# Patient Record
Sex: Male | Born: 1970 | Race: Black or African American | Hispanic: No | Marital: Single | Smoking: Current every day smoker
Health system: Southern US, Community
[De-identification: ages and names within clinical notes are randomized; demographics above are authoritative.]

## PROBLEM LIST (undated history)

## (undated) DIAGNOSIS — I10 Essential (primary) hypertension: Secondary | ICD-10-CM

## (undated) HISTORY — PX: COSMETIC SURGERY: SHX468

## (undated) HISTORY — PX: MYRINGOTOMY: SUR874

## (undated) HISTORY — PX: APPENDECTOMY: SHX54

## (undated) HISTORY — PX: TONSILLECTOMY: SUR1361

---

## 2002-03-20 ENCOUNTER — Encounter (INDEPENDENT_AMBULATORY_CARE_PROVIDER_SITE_OTHER): Payer: Self-pay | Admitting: *Deleted

## 2002-03-20 ENCOUNTER — Ambulatory Visit (HOSPITAL_BASED_OUTPATIENT_CLINIC_OR_DEPARTMENT_OTHER): Admission: RE | Admit: 2002-03-20 | Discharge: 2002-03-20 | Payer: Self-pay | Admitting: Plastic Surgery

## 2002-09-23 ENCOUNTER — Emergency Department (HOSPITAL_COMMUNITY): Admission: EM | Admit: 2002-09-23 | Discharge: 2002-09-24 | Payer: Self-pay | Admitting: Emergency Medicine

## 2002-09-24 ENCOUNTER — Encounter: Payer: Self-pay | Admitting: Emergency Medicine

## 2008-12-31 ENCOUNTER — Emergency Department (HOSPITAL_COMMUNITY): Admission: EM | Admit: 2008-12-31 | Discharge: 2008-12-31 | Payer: Self-pay | Admitting: *Deleted

## 2010-05-29 ENCOUNTER — Emergency Department (HOSPITAL_COMMUNITY): Admission: EM | Admit: 2010-05-29 | Discharge: 2010-05-29 | Payer: Self-pay | Admitting: Emergency Medicine

## 2011-01-14 LAB — URINALYSIS, ROUTINE W REFLEX MICROSCOPIC
Bilirubin Urine: NEGATIVE
Glucose, UA: NEGATIVE mg/dL
Ketones, ur: NEGATIVE mg/dL
Leukocytes, UA: NEGATIVE
Nitrite: NEGATIVE
Protein, ur: NEGATIVE mg/dL
Specific Gravity, Urine: 1.02 (ref 1.005–1.030)
Urobilinogen, UA: 0.2 mg/dL (ref 0.0–1.0)
pH: 6 (ref 5.0–8.0)

## 2011-01-14 LAB — BASIC METABOLIC PANEL
BUN: 11 mg/dL (ref 6–23)
CO2: 24 mEq/L (ref 19–32)
Calcium: 9.4 mg/dL (ref 8.4–10.5)
Chloride: 105 mEq/L (ref 96–112)
Creatinine, Ser: 0.93 mg/dL (ref 0.4–1.5)
GFR calc Af Amer: >60 mL/min (ref >60)
GFR calc non Af Amer: >60 mL/min (ref >60)
Glucose, Bld: 105 mg/dL — ABNORMAL HIGH (ref 70–99)
Potassium: 4.2 mEq/L (ref 3.5–5.1)
Sodium: 137 mEq/L (ref 135–145)

## 2011-01-14 LAB — CBC
HCT: 48.2 % (ref 39.0–52.0)
Hemoglobin: 16.2 g/dL (ref 13.0–17.0)
MCH: 29.9 pg (ref 26.0–34.0)
MCHC: 33.5 g/dL (ref 30.0–36.0)
MCV: 89.1 fL (ref 78.0–100.0)
Platelets: 255 10*3/uL (ref 150–400)
RBC: 5.42 MIL/uL (ref 4.22–5.81)
RDW: 13.7 % (ref 11.5–15.5)
WBC: 8.1 10*3/uL (ref 4.0–10.5)

## 2011-01-14 LAB — HEPATIC FUNCTION PANEL
AST: 42 U/L — ABNORMAL HIGH (ref 0–37)
Albumin: 4.4 g/dL (ref 3.5–5.2)
Bilirubin, Direct: 0.1 mg/dL (ref 0.0–0.3)
Total Protein: 8.1 g/dL (ref 6.0–8.3)

## 2011-01-14 LAB — URINE MICROSCOPIC-ADD ON

## 2011-01-14 LAB — DIFFERENTIAL
Eosinophils Absolute: 0.2 10*3/uL (ref 0.0–0.7)
Lymphocytes Relative: 20 % (ref 12–46)
Lymphs Abs: 1.6 10*3/uL (ref 0.7–4.0)
Neutro Abs: 5.4 10*3/uL (ref 1.7–7.7)
Neutrophils Relative %: 66 % (ref 43–77)

## 2011-01-14 LAB — LIPASE, BLOOD: Lipase: 26 U/L (ref 11–59)

## 2011-01-14 LAB — AMYLASE: Amylase: 52 U/L (ref 0–105)

## 2011-03-17 NOTE — Op Note (Signed)
. [facility]  Patient:    DMANI, MIZER Visit Number: [id number] MRN: [id number]          Service Type: DSU Location: [facility] Attending Physician:  Eloise Levels Dictated by:   Mary A. Contogiannis, M.D. Admit Date:  03/20/2002 Discharge Date: 03/20/2002                             Operative Report  She says this is already dictated. Dictated by:   Mary A. Contogiannis, M.D. Attending Physician:  Eloise Levels DD:  03/20/02 TD:  03/24/02 Job: [id number]

## 2011-03-17 NOTE — Discharge Summary (Signed)
[facility]  Patient:    Mark Underwood, Mark Underwood Visit Number: [id number] MRN: [id number]          Service Type: DSU Location: [facility] Attending Physician:  Eloise Levels Dictated by:   Mary A. Contogiannis, M.D. Admit Date:  03/20/2002 Discharge Date: 03/20/2002                             Discharge Summary  PREOPERATIVE DIAGNOSIS:  Recurrent right buttock skin and soft tissue lesion.  POSTOPERATIVE DIAGNOSIS:  Recurrent right buttock skin and soft tissue lesion.  OPERATION: 1. Excision of 3.5 cm right buttock skin and soft tissue lesion. 2. Complex closure of 4.5 cm right buttock incision.  SURGEON:  Mary A. Contogiannis, M.D.  ANESTHESIA:  1% lidocaine with epinephrine.  INDICATIONS:  The patient is a 40 year old African-American male who had presented for evaluation of a mass in the right medial buttock.  This area appears to have had a recurrent infection.  It likely is a cyst or glandular tissue.  He presents to undergo excision of this soft tissue mass now that the infection has resolved.  DESCRIPTION OF PROCEDURE:  The patient was patient was brought operating room and placed on the table in the prone position.  The buttock areas were prepped with Betadine and draped in sterile fashion.  Skin and subcutaneous tissue in the area of the soft tissue mass and skin lesion were then injected with 1% lidocaine with epinephrine.  After adequate hemostasis and anesthesia had taken effect, the procedure was begun.  There was a skin pore that appeared to be clinically involved along with scar tissue on the skin.  All of this area was excised with about a 1 to 2 mm margin of normal skin around it.  The incision was carried through the skin into the fatty layer.  Upon entering the fatty layer, there was more apparent scar tissue present, and all of this was excised down to the superficial fascial layer.  The total area of excision measured  3.5 cm.  The wound was then closed in complex fashion.  The superficial fascial layer and deeper tissues were closed with 3-0 Monocryl suture.  The dermal layer was then closed with 3-0 Monocryl suture.  The skin was then closed with a 4-0 Monocryl intracuticular stitch.  The incision was dressed with Benzoin and Steri-Strips.  There were no complications.  The patient tolerated the procedure well.  The patient was then taught proper wound care and discharged home in stable condition.  He will finish out his perioperative antibiotics.  Followup appointment will be tomorrow in the office. Dictated by:   Mary A. Contogiannis, M.D. Attending Physician:  Eloise Levels DD:  03/20/02 TD:  03/22/02 Job: [id number]

## 2011-05-23 IMAGING — CT CT ABD-PELV W/O CM
3 of 4 series · 8 of 46 positions shown, 15 images · non-contrast
Comparison: None.

CLINICAL DATA: Right flank pain.  Previous appendectomy

CT ABDOMEN AND PELVIS WITHOUT CONTRAST
TECHNIQUE: Multidetector CT imaging of the abdomen and pelvis was
performed following the standard protocol without intravenous
contrast.

[Series 3: lung 5.0 b60f · axial · 0.70mm/px · z∈[-122,-62]mm · 4 of 22 slices shown, 9 images]
[im 5/22  soft-tissue]
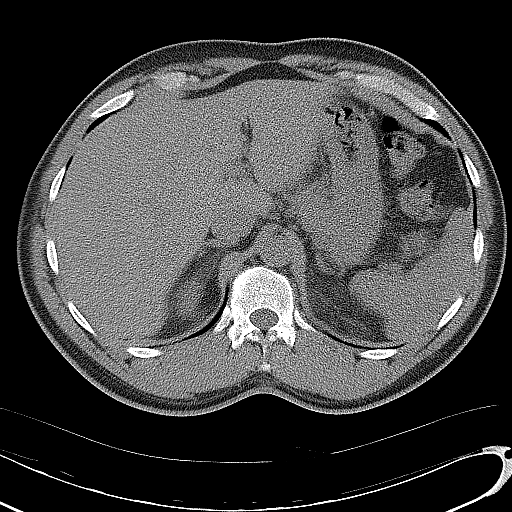
[im 5/22  lung]
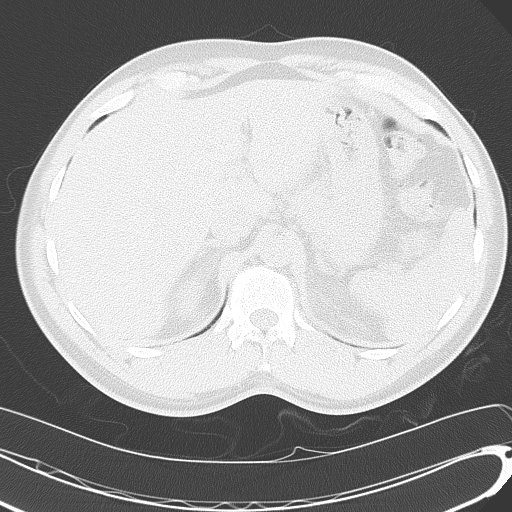
[im 5/22  bone]
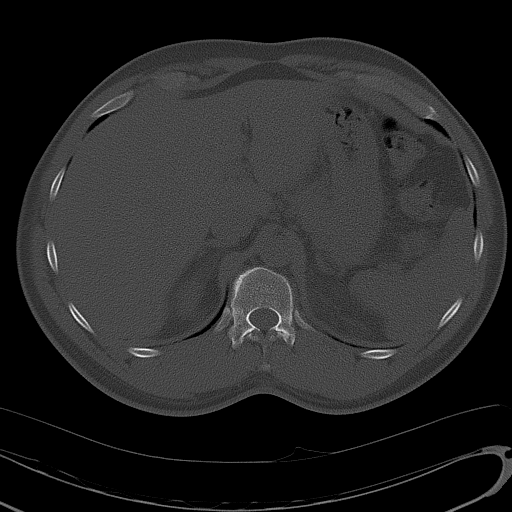
[im 9/22  soft-tissue]
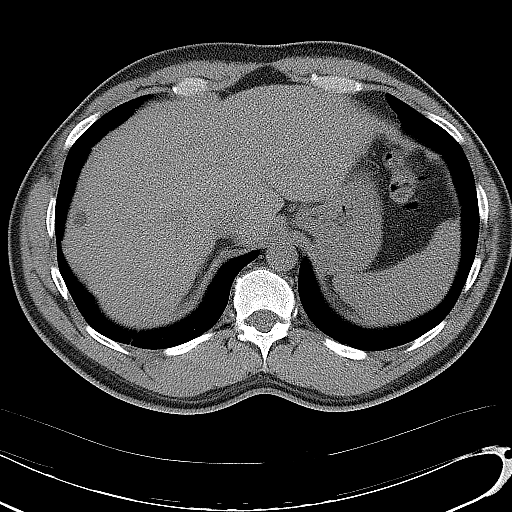
[im 9/22  lung]
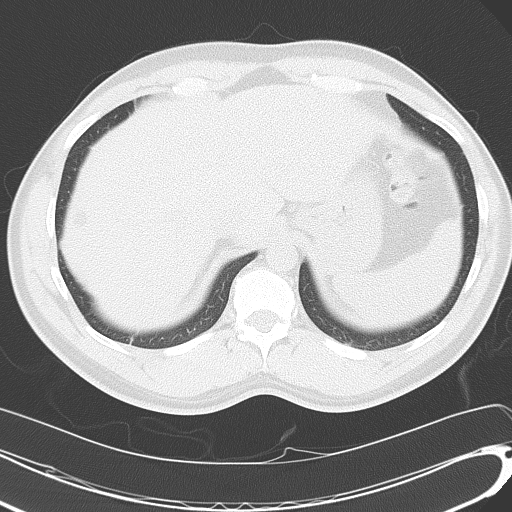
[im 13/22  soft-tissue]
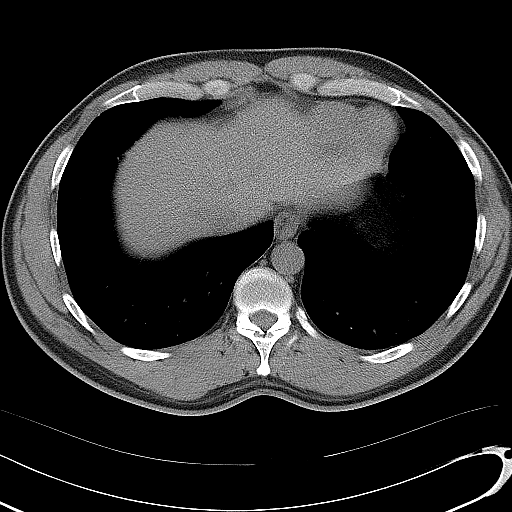
[im 13/22  lung]
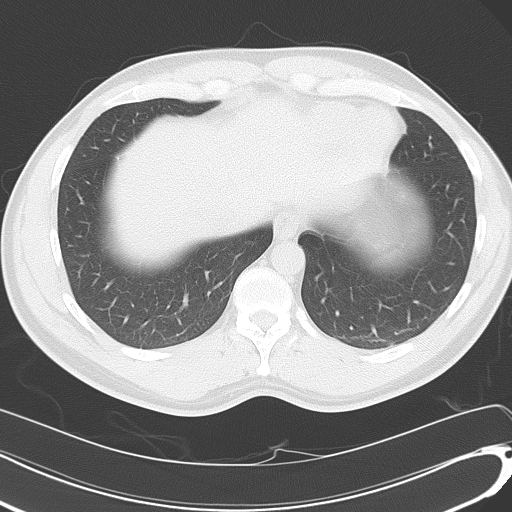
[im 17/22  soft-tissue]
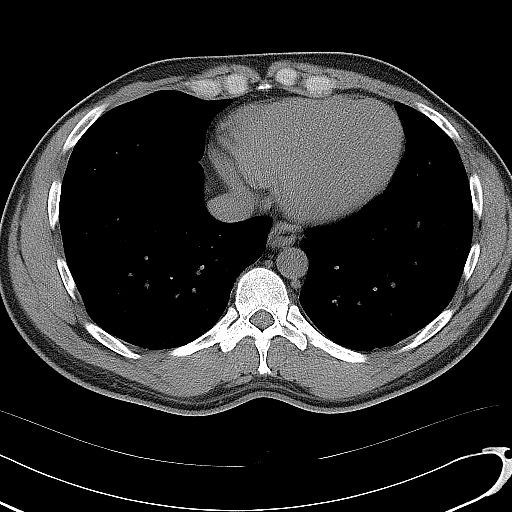
[im 17/22  lung]
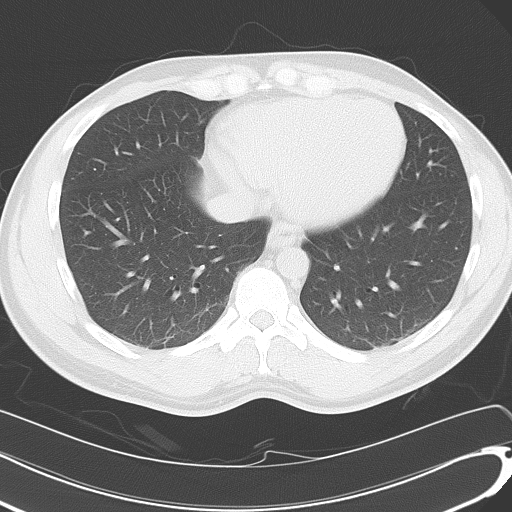

[Series 4: mpr coronal (id) · coronal · 0.87mm/px · 3 of 79 slices shown, 4 images]
[im 27/79  soft-tissue]
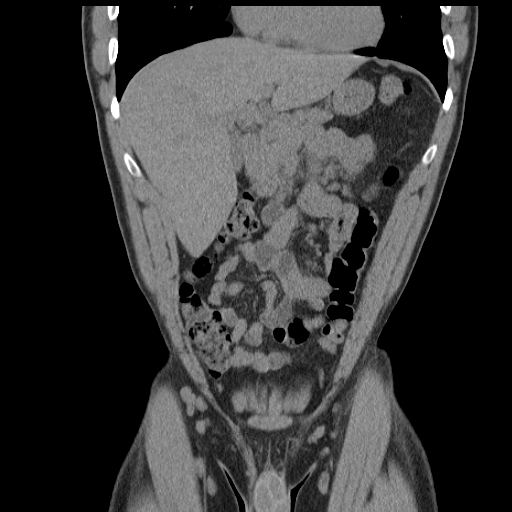
[im 35/79  soft-tissue]
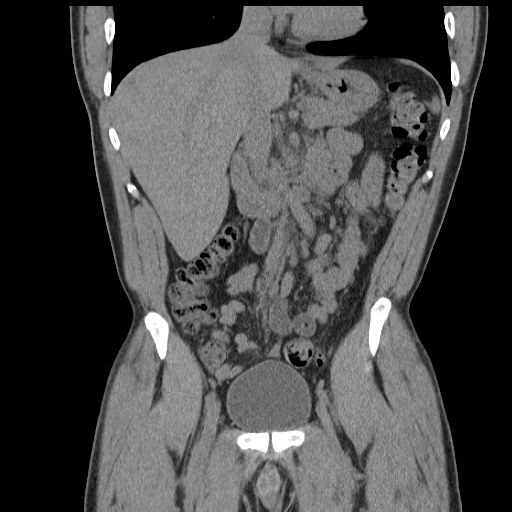
[im 35/79  bone]
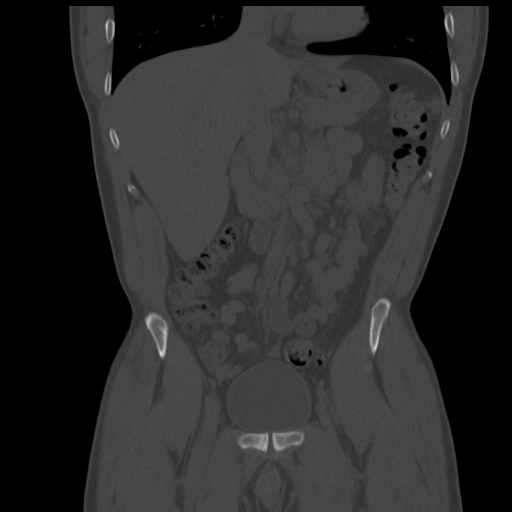
[im 44/79  soft-tissue]
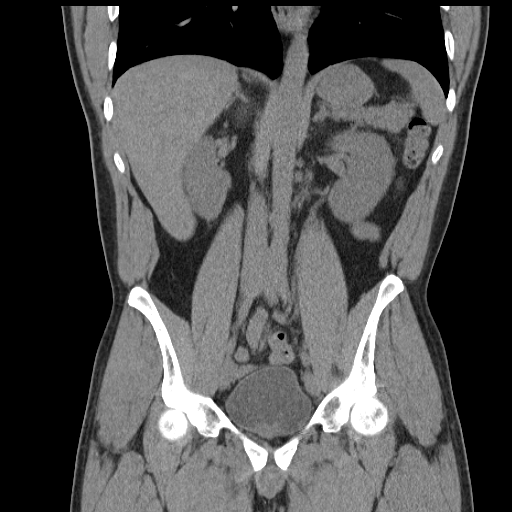

[Series 5: mpr sagittal (id) · sagittal · 0.59mm/px · 1 of 107 slices shown, 2 images]
[im 36/107  soft-tissue]
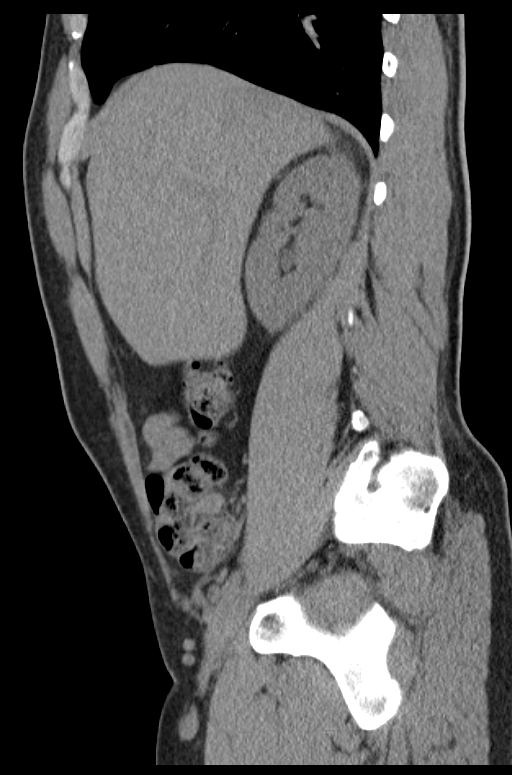
[im 36/107  bone]
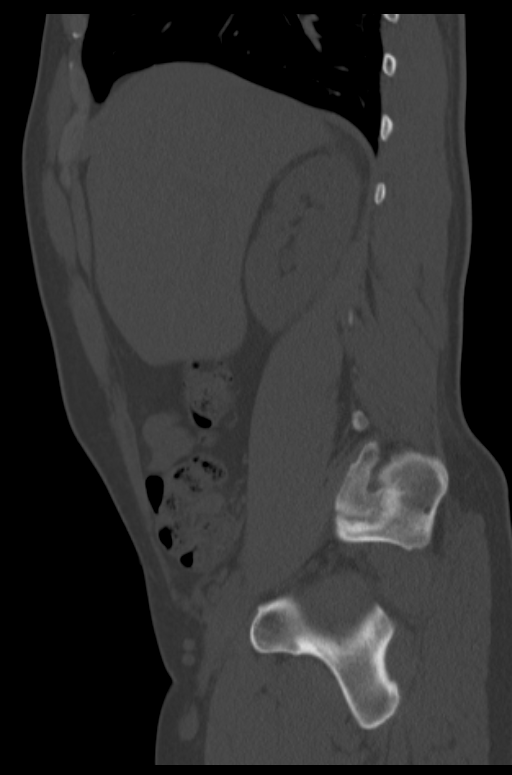

[8 of 46 positions shown; findings below may reference images not displayed]

FINDINGS: Minimal dependent atelectasis in the visualized lung
bases.

9 mm probable subcapsular cyst in the right hepatic lobe image 14
sequence 2. Unremarkable uninfused evaluation of spleen, kidneys,
adrenal glands, nondistended gallbladder, pancreas, abdominal
aorta, and small bowel.  There is a vascular clip at the base of
the cecum post appendectomy.  The colon is nondilated. Urinary
bladder is nondistended.  There is moderate prostatic enlargement.

There are focal areas of inflammatory/edematous change in the
adipose tissue in the anterior peritoneum, perirenal
retroperitoneal fat bilaterally, and subcutaneous fat of the
bilateral flanks.
IMPRESSION: 1.  Scattered focal areas of inflammatory/edematous change versus
fat necrosis in subcutaneous, retroperitoneal, and mesenteric fat.
The distribution is unusual.  Correlate with any history of
pancreatitis or connective tissue disorder. I telephoned the
critical test results to Dr. Se at the time of interpretation.

## 2012-01-17 ENCOUNTER — Emergency Department (HOSPITAL_COMMUNITY)
Admission: EM | Admit: 2012-01-17 | Discharge: 2012-01-17 | Disposition: A | Discharge status: Home / Self Care | Payer: Self-pay | Attending: Emergency Medicine | Admitting: Emergency Medicine

## 2012-01-17 ENCOUNTER — Other Ambulatory Visit: Payer: Self-pay

## 2012-01-17 ENCOUNTER — Emergency Department (HOSPITAL_COMMUNITY): Payer: Self-pay

## 2012-01-17 ENCOUNTER — Encounter (HOSPITAL_COMMUNITY): Payer: Self-pay | Admitting: *Deleted

## 2012-01-17 DIAGNOSIS — R0781 Pleurodynia: Secondary | ICD-10-CM

## 2012-01-17 DIAGNOSIS — R0602 Shortness of breath: Secondary | ICD-10-CM | POA: Insufficient documentation

## 2012-01-17 DIAGNOSIS — R079 Chest pain, unspecified: Secondary | ICD-10-CM | POA: Insufficient documentation

## 2012-01-17 DIAGNOSIS — I10 Essential (primary) hypertension: Secondary | ICD-10-CM | POA: Insufficient documentation

## 2012-01-17 LAB — CBC
HCT: 46.2 % (ref 39.0–52.0)
Hemoglobin: 16.5 g/dL (ref 13.0–17.0)
MCH: 30.4 pg (ref 26.0–34.0)
RBC: 5.43 MIL/uL (ref 4.22–5.81)

## 2012-01-17 LAB — BASIC METABOLIC PANEL
BUN: 16 mg/dL (ref 6–23)
CO2: 25 mEq/L (ref 19–32)
Calcium: 9.6 mg/dL (ref 8.4–10.5)
Glucose, Bld: 107 mg/dL — ABNORMAL HIGH (ref 70–99)
Sodium: 135 mEq/L (ref 135–145)

## 2012-01-17 MED ORDER — LISINOPRIL 10 MG PO TABS: 10.0000 mg | ORAL_TABLET | Freq: Every day | ORAL | Status: AC

## 2012-01-17 MED ORDER — NAPROXEN 500 MG PO TABS: 500.0000 mg | ORAL_TABLET | Freq: Two times a day (BID) | ORAL | Status: AC

## 2012-01-17 NOTE — ED Notes (Signed)
Reports onset of sharp left upper chest pain while watching TV approx 3 hours ago; denies n/v; denies SOB; states pain worse with movement and deep breaths.

## 2012-01-17 NOTE — ED Provider Notes (Signed)
History     CSN: [id number]  Arrival date & time 01/17/12  0307   First MD Initiated Contact with Patient 01/17/12 [phone]      Chief Complaint  Patient presents with  . Chest Pain    (Consider location/radiation/quality/duration/timing/severity/associated sxs/prior treatment) HPI Comments: 41 year old male with no significant past medical history other than being a daily smoker presents with a complaint of chest pain. He states that he was sitting on his couch watching television, had acute onset of left sided sharp upper chest pain that started 3 hours ago. He denies associated symptoms including leg swelling trauma travel immobilization nausea vomiting shortness of breath cough or back pain. This pain is like a sharp and shooting pain that is intermittent, lasts for less than a second, is worse with movement and breathing deeply. He denies coughing fevers or other complaints. He has never had this pain before. He denies a history of hypertension diabetes hypercholesterolemia and has no family history of significant coronary disease per his report.  Patient is 41 y.o. male presenting with chest pain. The history is provided by the patient.  Chest Pain     History reviewed. No pertinent past medical history.  Past Surgical History  Procedure Date  . Tonsillectomy   . Myringotomy   . Appendectomy   . Cosmetic surgery     History reviewed. No pertinent family history.  History  Substance Use Topics  . Smoking status: Current Everyday Smoker -- 1.0 packs/day    Types: Cigarettes  . Smokeless tobacco: Not on file  . Alcohol Use: Yes     states 1 beer a day      Review of Systems  Cardiovascular: Positive for chest pain.  All other systems reviewed and are negative.    Allergies  Review of patient's allergies indicates no known allergies.  Home Medications   Current Outpatient Rx  Name Route Sig Dispense Refill  . LISINOPRIL 10 MG PO TABS Oral Take 1 tablet (10  mg total) by mouth daily. 30 tablet 1  . NAPROXEN 500 MG PO TABS Oral Take 1 tablet (500 mg total) by mouth 2 (two) times daily with a meal. 30 tablet 0    BP 170/103  Pulse 65  Temp(Src) 97.8 F (36.6 C) (Oral)  Resp 18  Ht 5\' 6"  (1.676 m)  Wt 180 lb (81.647 kg)  BMI 29.05 kg/m2  SpO2 100%  Physical Exam  Nursing note and vitals reviewed. Constitutional: He appears well-developed and well-nourished. No distress.  HENT:  Head: Normocephalic and atraumatic.  Mouth/Throat: Oropharynx is clear and moist. No oropharyngeal exudate.  Eyes: Conjunctivae and EOM are normal. Pupils are equal, round, and reactive to light. Right eye exhibits no discharge. Left eye exhibits no discharge. No scleral icterus.  Neck: Normal range of motion. Neck supple. No JVD present. No thyromegaly present.  Cardiovascular: Normal rate, regular rhythm, normal heart sounds and intact distal pulses.  Exam reveals no gallop and no friction rub.   No murmur heard. Pulmonary/Chest: Effort normal and breath sounds normal. No respiratory distress. He has no wheezes. He has no rales. He exhibits no tenderness.  Abdominal: Soft. Bowel sounds are normal. He exhibits no distension and no mass. There is no tenderness.  Musculoskeletal: Normal range of motion. He exhibits no edema and no tenderness.  Lymphadenopathy:    He has no cervical adenopathy.  Neurological: He is alert. Coordination normal.  Skin: Skin is warm and dry. No rash noted. No erythema.  Psychiatric: He has a normal mood and affect. His behavior is normal.    ED Course  Procedures (including critical care time)  ED ECG REPORT   Date: 01/17/2012   Rate: 90  Rhythm: normal sinus rhythm  QRS Axis: left  Intervals: normal  ST/T Wave abnormalities: J-point elevation present  Conduction Disutrbances:none  Narrative Interpretation: Left ventricular hypertrophy  Old EKG Reviewed: none available   Labs Reviewed  BASIC METABOLIC PANEL - Abnormal;  Notable for the following:    Glucose, Bld 107 (*)    GFR calc non Af Amer 87 (*)    All other components within normal limits  CBC  TROPONIN I   No results found.   1. Pleuritic chest pain   2. Hypertension       MDM  Patient has normal EKG with J-point elevation, left ventricular hypertrophy is present, has hypertension at 170/105. He denies cocaine use, history of coronary disease and his pain is extremely atypical for acute coronary syndrome. Will evaluate with troponin, basic metabolic panel CBC, chest x-ray, reevaluate. He has declined pain medications at this time.  Troponin negative, renal function normal, persistently hypertensive.  X-rays negative according to my interpretation, recheck of his blood pressure is 175/100. I have discussed with the patient at length the need for close followup with her family Dr. for recheck of his blood pressure. I have explained to him the need for medication at this time and will start him on lisinopril 10 mg by mouth daily. He has expressed his understanding and has agreed to followup with a family Dr. Peggye Form given him the resource list. I have also explained to him the possible side effects of ACE inhibitor's including angioedema. He has expressed his understanding.  Discharge Prescriptions include:  #1 Naprosyn #2 lisinopril  Vida Roller, MD 01/17/12 [phone]

## 2012-01-17 NOTE — Discharge Instructions (Signed)
Your caregiver has diagnosed you as having chest pain that is nonspecific for one problem. This means that after looking at you and examining you and ordering tests (such as blood work, chest x-rays and EKG), your caregiver does not believe that the problem is serious enough to need watching in the hospital. This judgment is often made after testing shows no acute heart attack and you are at low risk for sudden acute heart condition. Chest pain comes from many different causes.  Followup with your doctor in the next 2 weeks for a recheck of your blood pressure. Take the new medication once a day exactly as prescribed. If you develop swelling of the lips or tongue or difficulty breathing stop the medication immediately and return to the hospital for a recheck.  Seek immediate medical attention if:   You have severe chest pain, especially if the pain is crushing or pressure-like and spreads to the arms, back, neck, or jaw, or if you have sweating, nausea, shortness of breath. This is an emergency. Don't wait to see if the pain will go away. Get medical help at once. Call 911 immediately. Do not drive herself to the hospital.   Your chest pain gets worse and does not go away with rest.   You have an attack of chest pain lasting longer than usual, despite rest and treatment with the medications your caregiver has prescribed   You awaken from sleep with chest pain or shortness of breath.   You feel faint or dizzy   You have chest pain not typical of your usual pain for which you originally saw your caregiver.  You must have a repeat evaluation within 24 hours for a recheck of your heart.  Please call your doctor this morning to schedule this appointment. If you do not have a family doctor, please see the list of doctors below.  RESOURCE GUIDE  Dental Problems  Patients with Medicaid: [facility] [phone]                                            [id number] [employer] Phone:  [phone]                                                  Phone:  [phone]  If unable to pay or uninsured, contact:  [facility] or [facility]. to become qualified for the adult dental clinic.  Chronic Pain Problems Contact [facility]  [phone] Patients need to be referred by their primary care doctor.  Insufficient Money for Medicine Contact [employer]:  call "211" or [facility] [phone].  No Primary Care Doctor Call [facility]  [phone] Other agencies that provide inexpensive medical care    [facility]  [phone]    [facility]  [phone]    [facility]  [phone]    [facility]  [phone]    [facility]  [phone]    [facility]  [phone]  Psychological Services [facility]  [phone] [facility]  [phone] [facility]   [phone] (emergency services [phone])  Substance Abuse Resources [facility]  [phone] [facility] [phone] [facility] [phone] Floydene Flock [phone] Residential & Outpatient Substance Abuse Program  [phone]  Abuse/Neglect [facility] Child Abuse Hotline 385-391-0371 [facility] Child Abuse Hotline (520)556-3676 (After Hours)  Emergency Shelter [facility] [phone]  Maternity Homes [facility] [place] [facility] (769)666-1678 [facility] [phone]  MRSA Hotline #:   (402) 756-7103    [facility] Resources  [facility]     [employer]                          [facility] [address] [place]                       [address]      [address]  Blondell Reveal Phone:  [phone]                                    Phone:  [phone]                 Phone:  [phone]  [facility] Phone:  [phone]  [facility] Child Abuse Hotline [phone] (After Hours)

## 2013-01-10 IMAGING — CR DG CHEST 1V PORT
1 series · 1 of 1 positions shown · non-contrast
Comparison: None.

CLINICAL DATA: Chest pain.

PORTABLE CHEST - 1 VIEW

[view not recorded]
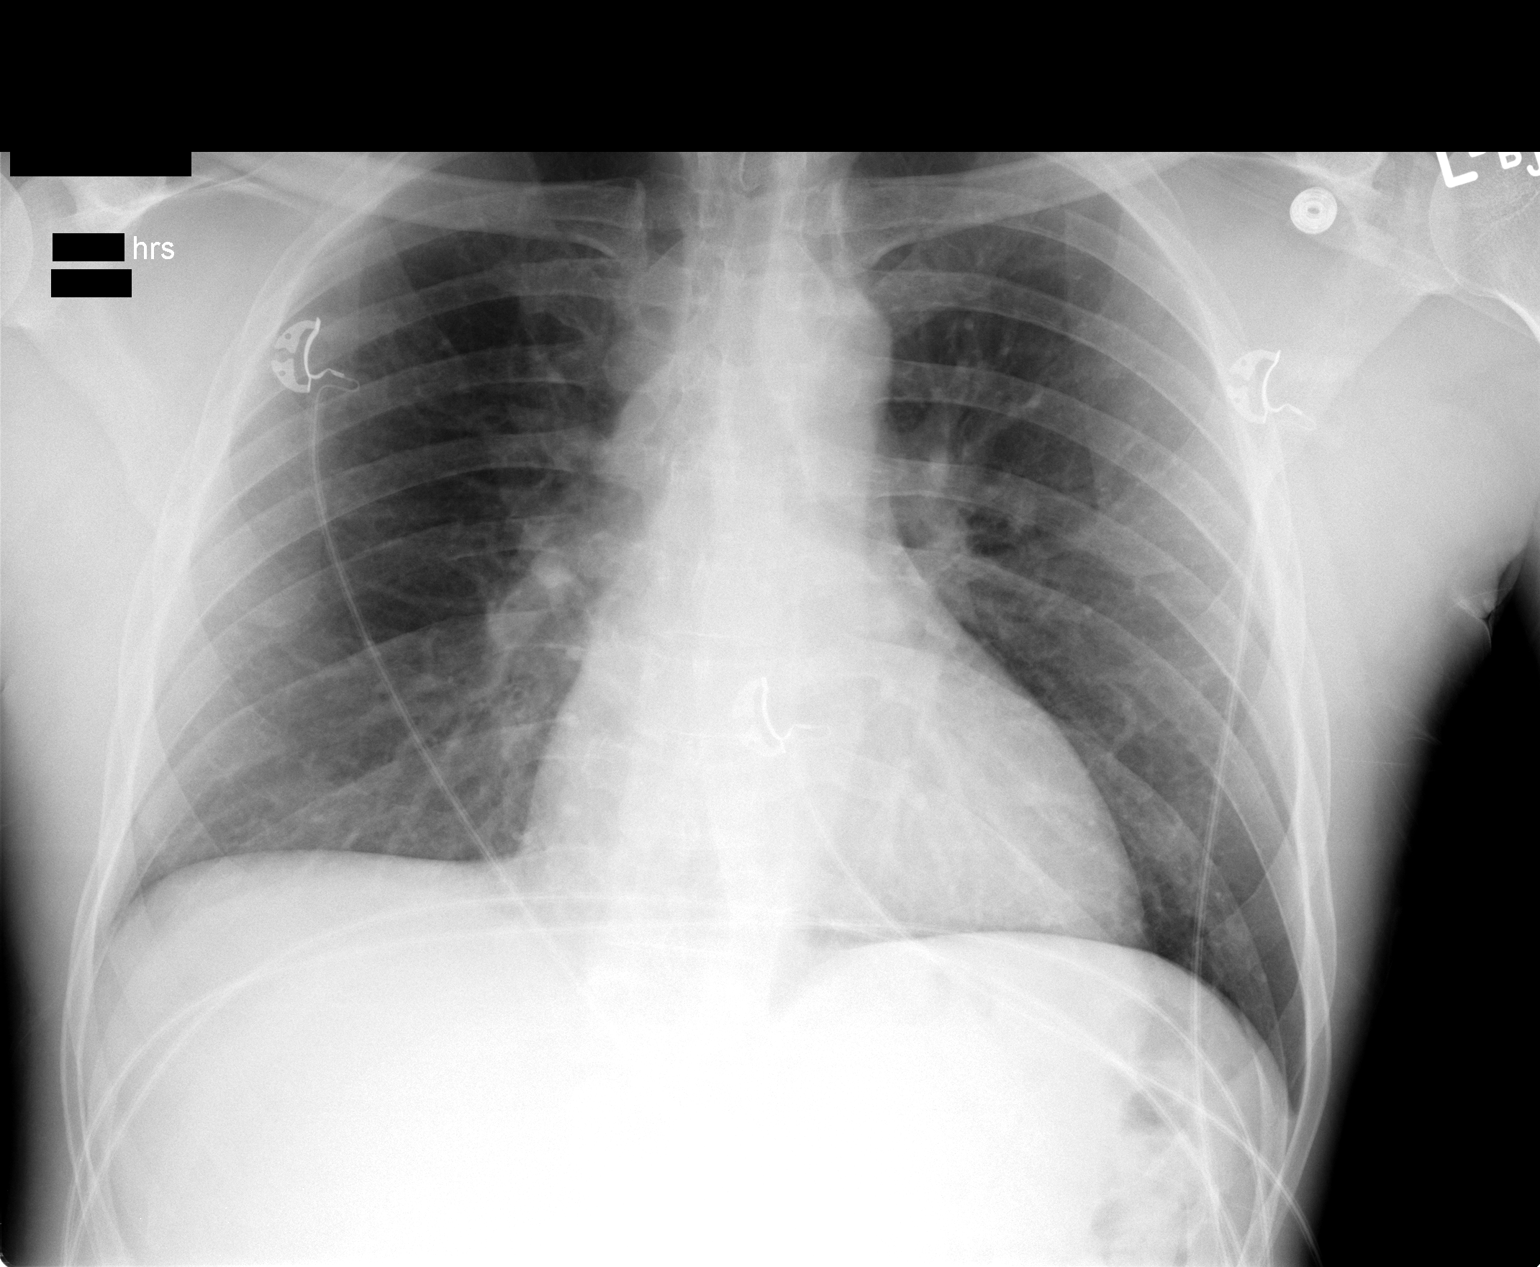

[1 of 1 positions shown; findings below may reference images not displayed]

FINDINGS: The lungs are well-aerated and clear.  There is no
evidence of focal opacification, pleural effusion or pneumothorax.
Pulmonary vascularity is at the upper limits of normal.

The cardiomediastinal silhouette is within normal limits.  No acute
osseous abnormalities are seen.
IMPRESSION: No acute cardiopulmonary process seen.

## 2020-01-15 ENCOUNTER — Ambulatory Visit: Payer: Self-pay | Attending: Internal Medicine

## 2020-01-15 ENCOUNTER — Ambulatory Visit: Payer: Self-pay

## 2020-01-15 DIAGNOSIS — Z23 Encounter for immunization: Secondary | ICD-10-CM

## 2020-01-15 NOTE — Progress Notes (Signed)
   Covid-19 Vaccination Clinic  Name:  Mark Underwood    MRN: [id number] DOB: 1970/11/20  01/15/2020  Mark Underwood was observed post Covid-19 immunization for 15 minutes without incident. He was provided with Vaccine Information Sheet and instruction to access the V-Safe system.   Mark Underwood was instructed to call 911 with any severe reactions post vaccine: Marland Kitchen Difficulty breathing  . Swelling of face and throat  . A fast heartbeat  . A bad rash all over body  . Dizziness and weakness   Immunizations Administered    Name Date Dose VIS Date Route   Pfizer COVID-19 Vaccine 01/15/2020 12:45 PM 0.3 mL 10/10/2019 Intramuscular   Manufacturer: [employer], [employer]   Lot: [id number]   NDC: [address]-8

## 2020-02-10 ENCOUNTER — Ambulatory Visit: Payer: Self-pay | Attending: Internal Medicine

## 2020-02-10 DIAGNOSIS — Z23 Encounter for immunization: Secondary | ICD-10-CM

## 2020-02-10 NOTE — Progress Notes (Signed)
   Covid-19 Vaccination Clinic  Name:  FINNEGAN GATTA    MRN: [id number] DOB: 1971-01-01  02/10/2020  Mr. Stolar was observed post Covid-19 immunization for 15 minutes without incident. He was provided with Vaccine Information Sheet and instruction to access the V-Safe system.   Mr. Pettengill was instructed to call 911 with any severe reactions post vaccine: Marland Kitchen Difficulty breathing  . Swelling of face and throat  . A fast heartbeat  . A bad rash all over body  . Dizziness and weakness   Immunizations Administered    Name Date Dose VIS Date Route   Pfizer COVID-19 Vaccine 02/10/2020 12:10 PM 0.3 mL 10/10/2019 Intramuscular   Manufacturer: [employer], [employer]   Lot: [id number]   NDC: [address]-7

## 2020-12-02 ENCOUNTER — Ambulatory Visit: Payer: Self-pay | Attending: Internal Medicine

## 2020-12-02 DIAGNOSIS — Z23 Encounter for immunization: Secondary | ICD-10-CM

## 2020-12-02 NOTE — Progress Notes (Signed)
   Covid-19 Vaccination Clinic  Name:  ARCHIBALD MARCHETTA    MRN: [id number] DOB: Jan 27, 1971  12/02/2020  Mr. Burdi was observed post Covid-19 immunization for 15 minutes without incident. He was provided with Vaccine Information Sheet and instruction to access the V-Safe system.   Mr. Duggar was instructed to call 911 with any severe reactions post vaccine: Marland Kitchen Difficulty breathing  . Swelling of face and throat  . A fast heartbeat  . A bad rash all over body  . Dizziness and weakness   Immunizations Administered    Name Date Dose VIS Date Route   PFIZER Comrnaty(Gray TOP) Covid-19 Vaccine 12/02/2020  1:31 PM 0.3 mL 10/07/2020 Intramuscular   Manufacturer: [employer], [employer]   Lot: [id number]   NDC: [id number]

## 2024-10-05 ENCOUNTER — Ambulatory Visit: Admission: EM | Admit: 2024-10-05 | Discharge: 2024-10-05 | Disposition: A | Discharge status: Home / Self Care

## 2024-10-05 DIAGNOSIS — S46811D Strain of other muscles, fascia and tendons at shoulder and upper arm level, right arm, subsequent encounter: Secondary | ICD-10-CM | POA: Diagnosis not present

## 2024-10-05 HISTORY — DX: Essential (primary) hypertension: I10

## 2024-10-05 MED ORDER — DEXAMETHASONE SOD PHOSPHATE PF 10 MG/ML IJ SOLN
10.0000 mg | Freq: Once | INTRAMUSCULAR | Status: AC
  Administered 2024-10-05: 10 mg via INTRAMUSCULAR

## 2024-10-05 NOTE — ED Triage Notes (Signed)
 Pt reports he has right side neck pain and shoulder pain x 2 weeks    Took baclofen and prednisone x 2  days  Pt denies injury

## 2024-10-05 NOTE — ED Provider Notes (Signed)
 [facility]    CSN: [id number] Arrival date & time: 10/05/24  1154      History   Chief Complaint No chief complaint on file.   HPI Mark Underwood is a 53 y.o. male.   Patient presenting today with 2-week history of ongoing right sided neck pain extending down into shoulder worse with movement.  Denies known injury to the area, weakness, numbness, tingling, radiation of pain down the right arm, headache, chest pain, shortness of breath.  Was seen 2 days ago for the same and given prednisone, baclofen but states these are not helping.    Past Medical History:  Diagnosis Date   Hypertension     There are no active problems to display for this patient.   Past Surgical History:  Procedure Laterality Date   APPENDECTOMY     COSMETIC SURGERY     MYRINGOTOMY     TONSILLECTOMY         Home Medications    Prior to Admission medications   Medication Sig Start Date End Date Taking? Authorizing Provider  baclofen (LIORESAL) 10 MG tablet Take 10 mg by mouth daily. 10/03/24  Yes [provider]  clobetasol cream (TEMOVATE) 0.05 % Apply topically 2 (two) times daily. 06/25/24  Yes [provider]  diclofenac (VOLTAREN) 75 MG EC tablet Take 75 mg by mouth 2 (two) times daily. 10/03/24  Yes [provider]  predniSONE (STERAPRED UNI-PAK 21 TAB) 10 MG (21) TBPK tablet SMARTSIG:- Tablet(s) By Mouth - 10/03/24  Yes [provider]  ZORYVE 0.3 % CREA Apply topically. 08/25/24  Yes [provider]  lisinopril -hydrochlorothiazide (ZESTORETIC) 20-12.5 MG tablet Take 1 tablet by mouth every morning.    [provider]    Family History History reviewed. No pertinent family history.  Social History Social History   Tobacco Use   Smoking status: Every Day    Current packs/day: 1.00    Types: Cigarettes  Substance Use Topics   Alcohol use: Yes    Comment: states 1 beer a day   Drug use: No     Allergies    Patient has no known allergies.   Review of Systems Review of Systems PER HPI  Physical Exam Triage Vital Signs ED Triage Vitals  Encounter Vitals Group     BP 10/05/24 1229 (!) 154/94     Girls Systolic BP Percentile --      Girls Diastolic BP Percentile --      Boys Systolic BP Percentile --      Boys Diastolic BP Percentile --      Pulse Rate 10/05/24 1229 81     Resp 10/05/24 1229 20     Temp 10/05/24 1229 98.5 F (36.9 C)     Temp Source 10/05/24 1229 Oral     SpO2 10/05/24 1229 97 %     Weight --      Height --      Head Circumference --      Peak Flow --      Pain Score 10/05/24 1230 8     Pain Loc --      Pain Education --      Exclude from Growth Chart --    No data found.  Updated Vital Signs BP (!) 154/94 (BP Location: Right Arm)   Pulse 81   Temp 98.5 F (36.9 C) (Oral)   Resp 20   SpO2 97%   Visual Acuity Right Eye Distance:   Left  Eye Distance:   Bilateral Distance:    Right Eye Near:   Left Eye Near:    Bilateral Near:     Physical Exam Vitals and nursing note reviewed.  Constitutional:      Appearance: Normal appearance.  HENT:     Head: Atraumatic.  Eyes:     Extraocular Movements: Extraocular movements intact.     Conjunctiva/sclera: Conjunctivae normal.  Cardiovascular:     Rate and Rhythm: Normal rate.  Pulmonary:     Effort: Pulmonary effort is normal.  Musculoskeletal:        General: Tenderness present. No swelling, deformity or signs of injury. Normal range of motion.     Cervical back: Normal range of motion and neck supple.     Comments: Significant tenderness to palpation to the right trapezius muscle extending up toward right side of neck, right shoulder.  Grip strength full and equal bilateral hands.  Bilateral upper extremities range of motion intact  Skin:    General: Skin is warm and dry.     Findings: No bruising or erythema.  Neurological:     Mental Status: He is oriented to person, place, and time.      Comments: Bilateral upper extremities neurovascularly intact  Psychiatric:        Mood and Affect: Mood normal.        Thought Content: Thought content normal.        Judgment: Judgment normal.      UC Treatments / Results  Labs (all labs ordered are listed, but only abnormal results are displayed) Labs Reviewed - No data to display  EKG   Radiology No results found.  Procedures Procedures (including critical care time)  Medications Ordered in UC Medications  dexamethasone  (DECADRON ) injection 10 mg (10 mg Intramuscular Given 10/05/24 1315)    Initial Impression / Assessment and Plan / UC Course  I have reviewed the triage vital signs and the nursing notes.  Pertinent labs & imaging results that were available during my care of the patient were reviewed by me and considered in my medical decision making (see chart for details).     Will add IM Decadron , continue previously prescribed medications, use heat, massage, stretches, rest.  Return for worsening or unresolving symptoms.  No red flag findings today.  Final Clinical Impressions(s) / UC Diagnoses   Final diagnoses:  Strain of right trapezius muscle, subsequent encounter     Discharge Instructions      We have given you a steroid shot today, continue the prescribed medications from several days ago, heat, massage, stretches, rest.  Return for worsening or unresolving symptoms.    ED Prescriptions   None    PDMP not reviewed this encounter.   Stuart Vernell Norris, [place] 10/05/24 1320

## 2024-10-05 NOTE — Discharge Instructions (Signed)
 We have given you a steroid shot today, continue the prescribed medications from several days ago, heat, massage, stretches, rest.  Return for worsening or unresolving symptoms.
